# Patient Record
Sex: Male | Born: 1970 | Race: Black or African American | Hispanic: No | Marital: Single | Smoking: Never smoker
Health system: Southern US, Community
[De-identification: ages and names within clinical notes are randomized; demographics above are authoritative.]

---

## 2014-03-24 ENCOUNTER — Emergency Department (HOSPITAL_COMMUNITY): Payer: Self-pay

## 2014-03-24 ENCOUNTER — Emergency Department (HOSPITAL_COMMUNITY)
Admission: EM | Admit: 2014-03-24 | Discharge: 2014-03-24 | Disposition: A | Discharge status: Home / Self Care | Payer: Self-pay | Attending: Emergency Medicine | Admitting: Emergency Medicine

## 2014-03-24 ENCOUNTER — Encounter (HOSPITAL_COMMUNITY): Payer: Self-pay | Admitting: Emergency Medicine

## 2014-03-24 DIAGNOSIS — Y999 Unspecified external cause status: Secondary | ICD-10-CM | POA: Insufficient documentation

## 2014-03-24 DIAGNOSIS — Y92410 Unspecified street and highway as the place of occurrence of the external cause: Secondary | ICD-10-CM | POA: Insufficient documentation

## 2014-03-24 DIAGNOSIS — S29001A Unspecified injury of muscle and tendon of front wall of thorax, initial encounter: Secondary | ICD-10-CM | POA: Insufficient documentation

## 2014-03-24 DIAGNOSIS — S4991XA Unspecified injury of right shoulder and upper arm, initial encounter: Secondary | ICD-10-CM | POA: Insufficient documentation

## 2014-03-24 DIAGNOSIS — Y939 Activity, unspecified: Secondary | ICD-10-CM | POA: Insufficient documentation

## 2014-03-24 DIAGNOSIS — M25511 Pain in right shoulder: Secondary | ICD-10-CM

## 2014-03-24 MED ORDER — OXYCODONE-ACETAMINOPHEN 5-325 MG PO TABS
2.0000 | ORAL_TABLET | Freq: Once | ORAL | Status: AC
  Administered 2014-03-24: 2 via ORAL
  Filled 2014-03-24: qty 2

## 2014-03-24 MED ORDER — OXYCODONE-ACETAMINOPHEN 5-325 MG PO TABS: 1.0000 | ORAL_TABLET | ORAL | Status: AC | PRN

## 2014-03-24 NOTE — ED Notes (Signed)
Patient transported to X-ray 

## 2014-03-24 NOTE — Progress Notes (Signed)
Orthopedic Tech Progress Note Patient Details:  Paul Mayo 02/25/1970 [id number]  Ortho Devices Type of Ortho Device: Sling immobilizer Ortho Device/Splint Interventions: Application   Haskell Flirtewsome, Tom Ragsdale M 03/24/2014, 11:33 PM

## 2014-03-24 NOTE — ED Notes (Signed)
MD at bedside. 
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
## 2014-03-24 NOTE — Discharge Instructions (Signed)

## 2014-03-24 NOTE — ED Notes (Signed)
Ortho notified of orders.  
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
